# Patient Record
Sex: Female | Born: 1965 | Race: Black or African American | Hispanic: No | Marital: Married
Health system: Southern US, Community
[De-identification: ages and names within clinical notes are randomized; demographics above are authoritative.]

## PROBLEM LIST (undated history)

## (undated) DIAGNOSIS — E785 Hyperlipidemia, unspecified: Secondary | ICD-10-CM

## (undated) DIAGNOSIS — I10 Essential (primary) hypertension: Secondary | ICD-10-CM

---

## 2004-08-14 ENCOUNTER — Encounter: Admission: RE | Admit: 2004-08-14 | Discharge: 2004-08-14 | Payer: Self-pay | Admitting: Family Medicine

## 2004-09-11 ENCOUNTER — Other Ambulatory Visit: Admission: RE | Admit: 2004-09-11 | Discharge: 2004-09-11 | Payer: Self-pay | Admitting: Obstetrics and Gynecology

## 2005-01-05 ENCOUNTER — Emergency Department: Payer: Self-pay | Admitting: Emergency Medicine

## 2005-04-21 ENCOUNTER — Ambulatory Visit: Payer: Self-pay | Admitting: Endocrinology

## 2006-06-14 ENCOUNTER — Ambulatory Visit: Payer: Self-pay | Admitting: Endocrinology

## 2007-06-22 ENCOUNTER — Ambulatory Visit: Payer: Self-pay | Admitting: Endocrinology

## 2009-12-13 ENCOUNTER — Emergency Department: Payer: Self-pay | Admitting: Emergency Medicine

## 2010-11-03 ENCOUNTER — Ambulatory Visit: Payer: Self-pay

## 2010-11-19 ENCOUNTER — Emergency Department: Payer: Self-pay | Admitting: Emergency Medicine

## 2011-11-26 ENCOUNTER — Ambulatory Visit
Admission: RE | Admit: 2011-11-26 | Discharge: 2011-11-26 | Disposition: A | Discharge status: Home / Self Care | Payer: BC Managed Care – PPO | Source: Ambulatory Visit | Attending: Family Medicine | Admitting: Family Medicine

## 2011-11-26 ENCOUNTER — Other Ambulatory Visit: Payer: Self-pay | Admitting: Family Medicine

## 2011-11-26 DIAGNOSIS — R202 Paresthesia of skin: Secondary | ICD-10-CM

## 2012-02-11 ENCOUNTER — Ambulatory Visit: Payer: Self-pay | Admitting: Family Medicine

## 2013-07-11 ENCOUNTER — Ambulatory Visit: Payer: Self-pay | Admitting: Family Medicine

## 2014-08-26 ENCOUNTER — Ambulatory Visit: Payer: Self-pay | Admitting: Family Medicine

## 2015-08-28 ENCOUNTER — Encounter: Payer: Self-pay | Admitting: *Deleted

## 2015-08-29 ENCOUNTER — Ambulatory Visit: Payer: BC Managed Care – PPO | Admitting: Anesthesiology

## 2015-08-29 ENCOUNTER — Encounter
Admission: RE | Disposition: A | Discharge status: Home Health | Payer: Self-pay | Source: Ambulatory Visit | Attending: Unknown Physician Specialty

## 2015-08-29 ENCOUNTER — Ambulatory Visit
Admission: RE | Admit: 2015-08-29 | Discharge: 2015-08-29 | Disposition: A | Discharge status: Home Health | Payer: BC Managed Care – PPO | Source: Ambulatory Visit | Attending: Unknown Physician Specialty | Admitting: Unknown Physician Specialty

## 2015-08-29 DIAGNOSIS — Z79899 Other long term (current) drug therapy: Secondary | ICD-10-CM | POA: Diagnosis not present

## 2015-08-29 DIAGNOSIS — D124 Benign neoplasm of descending colon: Secondary | ICD-10-CM | POA: Diagnosis not present

## 2015-08-29 DIAGNOSIS — E785 Hyperlipidemia, unspecified: Secondary | ICD-10-CM | POA: Insufficient documentation

## 2015-08-29 DIAGNOSIS — Z1211 Encounter for screening for malignant neoplasm of colon: Secondary | ICD-10-CM | POA: Diagnosis not present

## 2015-08-29 DIAGNOSIS — I10 Essential (primary) hypertension: Secondary | ICD-10-CM | POA: Diagnosis not present

## 2015-08-29 DIAGNOSIS — K640 First degree hemorrhoids: Secondary | ICD-10-CM | POA: Insufficient documentation

## 2015-08-29 HISTORY — DX: Essential (primary) hypertension: I10

## 2015-08-29 HISTORY — PX: COLONOSCOPY WITH PROPOFOL: SHX5780

## 2015-08-29 HISTORY — DX: Hyperlipidemia, unspecified: E78.5

## 2015-08-29 SURGERY — COLONOSCOPY WITH PROPOFOL
Anesthesia: General

## 2015-08-29 MED ORDER — MIDAZOLAM HCL 2 MG/2ML IJ SOLN
INTRAMUSCULAR | Status: DC | PRN
  Administered 2015-08-29: 2 mg via INTRAVENOUS

## 2015-08-29 MED ORDER — SODIUM CHLORIDE 0.9 % IV SOLN: INTRAVENOUS | Status: DC

## 2015-08-29 MED ORDER — PROPOFOL 10 MG/ML IV BOLUS
INTRAVENOUS | Status: DC | PRN
  Administered 2015-08-29: 60 mg via INTRAVENOUS

## 2015-08-29 MED ORDER — PROPOFOL 500 MG/50ML IV EMUL
INTRAVENOUS | Status: DC | PRN
  Administered 2015-08-29: 75 ug/kg/min via INTRAVENOUS

## 2015-08-29 MED ORDER — SODIUM CHLORIDE 0.9 % IV SOLN
INTRAVENOUS | Status: DC
  Administered 2015-08-29: 1000 mL via INTRAVENOUS

## 2015-08-29 NOTE — H&P (Signed)
   Primary Care Physician:  Cammy Copa, MD Primary Gastroenterologist:  Dr. Vira Agar  Pre-Procedure History & Physical: HPI:  Hannah Harmon is a 49 y.o. female is here for an colonoscopy.   Past Medical History  Diagnosis Date  . Hypertension   . Hyperlipidemia     No past surgical history on file.  Prior to Admission medications   Medication Sig Start Date End Date Taking? Authorizing Provider  amLODipine (NORVASC) 10 MG tablet Take 10 mg by mouth daily.   Yes Historical Provider, MD  estradiol (CLIMARA - DOSED IN MG/24 HR) 0.05 mg/24hr patch Place 0.05 mg onto the skin once a week.   Yes Historical Provider, MD  fexofenadine (ALLEGRA) 180 MG tablet Take 180 mg by mouth daily.   Yes Historical Provider, MD  hydrochlorothiazide (HYDRODIURIL) 25 MG tablet Take 25 mg by mouth daily.   Yes Historical Provider, MD  simvastatin (ZOCOR) 20 MG tablet Take 20 mg by mouth daily.   Yes Historical Provider, MD    Allergies as of 07/03/2015  . (Not on File)    No family history on file.  Social History   Social History  . Marital Status: Married    Spouse Name: N/A  . Number of Children: N/A  . Years of Education: N/A   Occupational History  . Not on file.   Social History Main Topics  . Smoking status: Not on file  . Smokeless tobacco: Not on file  . Alcohol Use: Not on file  . Drug Use: Not on file  . Sexual Activity: Not on file   Other Topics Concern  . Not on file   Social History Narrative  . No narrative on file    Review of Systems: See HPI, otherwise negative ROS  Physical Exam: BP 140/86 mmHg  Pulse 84  Temp(Src) 97.9 F (36.6 C) (Tympanic)  Resp 16  Ht 5\' 5"  (1.651 m)  Wt 81.647 kg (180 lb)  BMI 29.95 kg/m2  SpO2 97% General:   Alert,  pleasant and cooperative in NAD Head:  Normocephalic and atraumatic. Neck:  Supple; no masses or thyromegaly. Lungs:  Clear throughout to auscultation.    Heart:  Regular rate and rhythm. Abdomen:   Soft, nontender and nondistended. Normal bowel sounds, without guarding, and without rebound.   Neurologic:  Alert and  oriented x4;  grossly normal neurologically.  Impression/Plan: Hannah Harmon is here for an colonoscopy to be performed for screening  Risks, benefits, limitations, and alternatives regarding  colonoscopy have been reviewed with the patient.  Questions have been answered.  All parties agreeable.   Gaylyn Cheers, MD  08/29/2015, 11:40 AM

## 2015-08-29 NOTE — Transfer of Care (Signed)
Immediate Anesthesia Transfer of Care Note  Patient: Hannah Harmon  Procedure(s) Performed: Procedure(s): COLONOSCOPY WITH PROPOFOL (N/A)  Patient Location: PACU  Anesthesia Type:General  Level of Consciousness: awake, alert  and oriented  Airway & Oxygen Therapy: Patient Spontanous Breathing  Post-op Assessment: Post -op Vital signs reviewed and stable  Post vital signs: Reviewed  Last Vitals:  Filed Vitals:   08/29/15 1044  BP: 140/86  Pulse: 84  Temp: 36.6 C  Resp: 16    Complications: No apparent anesthesia complications

## 2015-08-29 NOTE — Anesthesia Preprocedure Evaluation (Signed)
Anesthesia Evaluation  Patient identified by MRN, date of birth, ID band  Reviewed: Allergy & Precautions, Patient's Chart, lab work & pertinent test results  Airway Mallampati: II       Dental no notable dental hx.    Pulmonary neg pulmonary ROS,           Cardiovascular Exercise Tolerance: Good hypertension, Pt. on medications  Rhythm:Regular Rate:Normal     Neuro/Psych negative neurological ROS     GI/Hepatic negative GI ROS, Neg liver ROS,   Endo/Other  negative endocrine ROS  Renal/GU negative Renal ROS     Musculoskeletal negative musculoskeletal ROS (+)   Abdominal   Peds negative pediatric ROS (+)  Hematology negative hematology ROS (+)   Anesthesia Other Findings   Reproductive/Obstetrics negative OB ROS                             Anesthesia Physical Anesthesia Plan  ASA: II  Anesthesia Plan: General   Post-op Pain Management:    Induction: Intravenous  Airway Management Planned: Natural Airway and Nasal Cannula  Additional Equipment:   Intra-op Plan:   Post-operative Plan:   Informed Consent: I have reviewed the patients History and Physical, chart, labs and discussed the procedure including the risks, benefits and alternatives for the proposed anesthesia with the patient or authorized representative who has indicated his/her understanding and acceptance.     Plan Discussed with: CRNA  Anesthesia Plan Comments:         Anesthesia Quick Evaluation

## 2015-08-29 NOTE — Op Note (Signed)
[facility] Gastroenterology Patient Name: Hannah Harmon Procedure Date: 08/29/2015 11:32 AM MRN: [id number] Account #: [id number] Date of Birth: 1965/11/11 Admit Type: Outpatient Age: 50 Room: [facility] ENDO ROOM 1 Gender: Female Note Status: Finalized Procedure:            Colonoscopy Indications:          Screening for colorectal malignant neoplasm Providers:            Manya Silvas, MD Referring MD:         Aura Dials, MD (Referring MD) Medicines:            Propofol per Anesthesia Complications:        No immediate complications. Procedure:            Pre-Anesthesia Assessment:                       - After reviewing the risks and benefits, the patient                        was deemed in satisfactory condition to undergo the                        procedure.                       After obtaining informed consent, the colonoscope was                        passed under direct vision. Throughout the procedure,                        the patient's blood pressure, pulse, and oxygen                        saturations were monitored continuously. The                        Colonoscope was introduced through the anus and                        advanced to the the cecum, identified by appendiceal                        orifice and ileocecal valve. The colonoscopy was                        performed without difficulty. The patient tolerated the                        procedure well. The quality of the bowel preparation                        was excellent. Findings:      A 10 mm polyp was found in the distal descending colon. The polyp was       semi-pedunculated. The polyp was removed with a hot snare. Resection and       retrieval were complete.      Internal hemorrhoids were found during endoscopy. The hemorrhoids were       small and Grade I (internal hemorrhoids that do not prolapse).      The exam  was otherwise without abnormality. Impression:            - One 10 mm polyp in the distal descending colon,                        removed with a hot snare. Resected and retrieved.                       - Internal hemorrhoids.                       - The examination was otherwise normal. Recommendation:       - Await pathology results. Manya Silvas, MD 08/29/2015 12:04:51 PM This report has been signed electronically. Number of Addenda: 0 Note Initiated On: 08/29/2015 11:32 AM Scope Withdrawal Time: 0 hours 11 minutes 54 seconds  Total Procedure Duration: 0 hours 18 minutes 39 seconds       [facility]

## 2015-08-29 NOTE — Anesthesia Postprocedure Evaluation (Signed)
Anesthesia Post Note  Patient: Hannah Harmon  Procedure(s) Performed: Procedure(s) (LRB): COLONOSCOPY WITH PROPOFOL (N/A)  Patient location during evaluation: PACU Anesthesia Type: General Level of consciousness: awake Pain management: pain level controlled Vital Signs Assessment: post-procedure vital signs reviewed and stable Respiratory status: spontaneous breathing Cardiovascular status: blood pressure returned to baseline Anesthetic complications: no    Last Vitals:  Filed Vitals:   08/29/15 1217 08/29/15 1219  BP: 129/88 129/88  Pulse: 80 80  Temp: 36.2 C   Resp: 16 11    Last Pain: There were no vitals filed for this visit.               VAN STAVEREN,Raynard Mapps

## 2015-08-29 NOTE — Anesthesia Postprocedure Evaluation (Signed)
Anesthesia Post Note  Patient: Hannah Harmon  Procedure(s) Performed: Procedure(s) (LRB): COLONOSCOPY WITH PROPOFOL (N/A)  Anesthesia Type: General Level of consciousness: awake Pain management: pain level controlled Vital Signs Assessment: post-procedure vital signs reviewed and stable Respiratory status: spontaneous breathing Cardiovascular status: blood pressure returned to baseline Anesthetic complications: no    Last Vitals:  Filed Vitals:   08/29/15 1217 08/29/15 1219  BP: 129/88 129/88  Pulse: 80 80  Temp: 36.2 C   Resp: 16 11    Last Pain: There were no vitals filed for this visit.               VAN STAVEREN,Serafino Burciaga

## 2015-09-01 ENCOUNTER — Encounter: Payer: Self-pay | Admitting: Unknown Physician Specialty

## 2015-09-01 LAB — SURGICAL PATHOLOGY

## 2016-04-15 ENCOUNTER — Other Ambulatory Visit: Payer: Self-pay | Admitting: Family Medicine

## 2016-04-15 DIAGNOSIS — Z1231 Encounter for screening mammogram for malignant neoplasm of breast: Secondary | ICD-10-CM

## 2016-05-14 ENCOUNTER — Ambulatory Visit
Admission: RE | Admit: 2016-05-14 | Discharge: 2016-05-14 | Disposition: A | Discharge status: Home / Self Care | Payer: BC Managed Care – PPO | Source: Ambulatory Visit | Attending: Family Medicine | Admitting: Family Medicine

## 2016-05-14 DIAGNOSIS — Z1231 Encounter for screening mammogram for malignant neoplasm of breast: Secondary | ICD-10-CM | POA: Diagnosis not present

## 2017-09-22 ENCOUNTER — Other Ambulatory Visit: Payer: Self-pay | Admitting: Family Medicine

## 2018-04-10 ENCOUNTER — Other Ambulatory Visit: Payer: Self-pay | Admitting: Family Medicine

## 2018-04-10 DIAGNOSIS — Z1231 Encounter for screening mammogram for malignant neoplasm of breast: Secondary | ICD-10-CM

## 2018-05-01 ENCOUNTER — Ambulatory Visit
Admission: RE | Admit: 2018-05-01 | Discharge: 2018-05-01 | Disposition: A | Discharge status: Home / Self Care | Payer: BC Managed Care – PPO | Source: Ambulatory Visit | Attending: Family Medicine | Admitting: Family Medicine

## 2018-05-01 DIAGNOSIS — Z1231 Encounter for screening mammogram for malignant neoplasm of breast: Secondary | ICD-10-CM | POA: Diagnosis not present

## 2019-09-28 ENCOUNTER — Other Ambulatory Visit: Payer: Self-pay | Admitting: Family Medicine

## 2019-09-28 DIAGNOSIS — Z1231 Encounter for screening mammogram for malignant neoplasm of breast: Secondary | ICD-10-CM

## 2019-10-19 ENCOUNTER — Ambulatory Visit
Admission: RE | Admit: 2019-10-19 | Discharge: 2019-10-19 | Disposition: A | Discharge status: Home / Self Care | Payer: BC Managed Care – PPO | Source: Ambulatory Visit | Attending: Family Medicine | Admitting: Family Medicine

## 2019-10-19 DIAGNOSIS — Z1231 Encounter for screening mammogram for malignant neoplasm of breast: Secondary | ICD-10-CM

## 2019-10-31 ENCOUNTER — Ambulatory Visit: Payer: BC Managed Care – PPO | Admitting: Dermatology

## 2020-04-01 ENCOUNTER — Encounter: Payer: Self-pay | Admitting: *Deleted

## 2020-12-05 ENCOUNTER — Other Ambulatory Visit: Payer: Self-pay | Admitting: Family Medicine

## 2020-12-05 DIAGNOSIS — Z1231 Encounter for screening mammogram for malignant neoplasm of breast: Secondary | ICD-10-CM

## 2020-12-18 ENCOUNTER — Other Ambulatory Visit: Payer: Self-pay

## 2020-12-18 ENCOUNTER — Ambulatory Visit
Admission: RE | Admit: 2020-12-18 | Discharge: 2020-12-18 | Disposition: A | Discharge status: Home / Self Care | Payer: BC Managed Care – PPO | Source: Ambulatory Visit | Attending: Family Medicine | Admitting: Family Medicine

## 2020-12-18 DIAGNOSIS — Z1231 Encounter for screening mammogram for malignant neoplasm of breast: Secondary | ICD-10-CM | POA: Diagnosis present

## 2021-04-02 ENCOUNTER — Telehealth: Payer: Self-pay

## 2021-04-02 NOTE — Telephone Encounter (Signed)
Called patient no answer no way to leave message

## 2021-04-14 ENCOUNTER — Encounter: Payer: Self-pay | Admitting: Family Medicine

## 2022-02-01 ENCOUNTER — Other Ambulatory Visit: Payer: Self-pay | Admitting: Family Medicine

## 2022-02-01 DIAGNOSIS — Z1231 Encounter for screening mammogram for malignant neoplasm of breast: Secondary | ICD-10-CM

## 2022-02-09 ENCOUNTER — Ambulatory Visit
Admission: RE | Admit: 2022-02-09 | Discharge: 2022-02-09 | Disposition: A | Discharge status: Home / Self Care | Payer: BC Managed Care – PPO | Source: Ambulatory Visit | Attending: Family Medicine | Admitting: Family Medicine

## 2022-02-09 DIAGNOSIS — Z1231 Encounter for screening mammogram for malignant neoplasm of breast: Secondary | ICD-10-CM | POA: Insufficient documentation

## 2022-11-04 IMAGING — MG MM DIGITAL SCREENING BILAT W/ TOMO AND CAD
8 series · 8 of 24 positions shown · non-contrast
Comparison: Previous exam(s).

CLINICAL DATA: Screening.

EXAM:
DIGITAL SCREENING BILATERAL MAMMOGRAM WITH TOMOSYNTHESIS AND CAD
TECHNIQUE: Bilateral screening digital craniocaudal and mediolateral oblique
mammograms were obtained. Bilateral screening digital breast
tomosynthesis was performed. The images were evaluated with
computer-aided detection.

[L CC synth-2D]
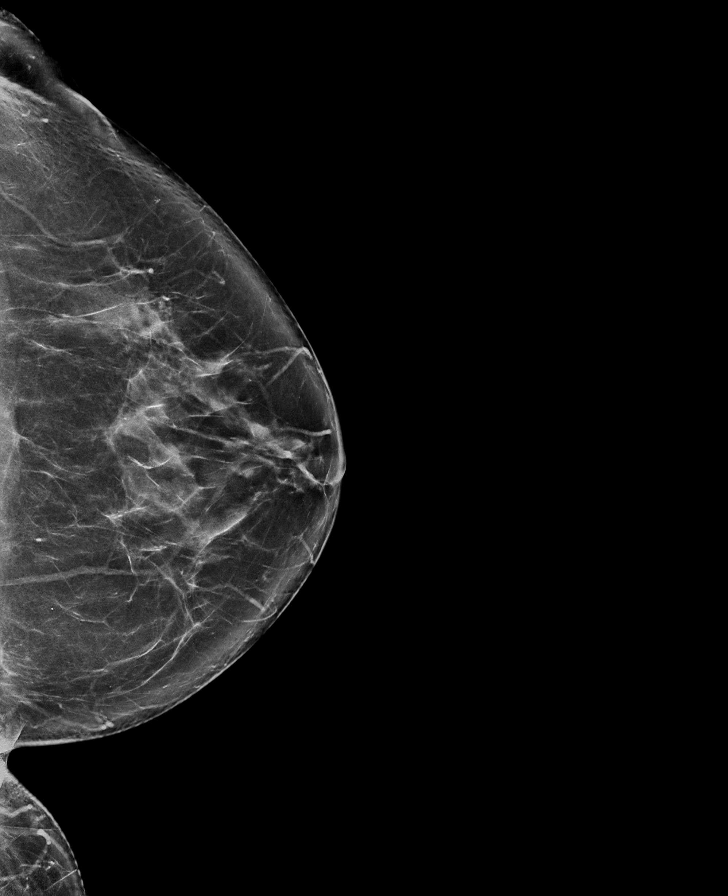

[R CC synth-2D]
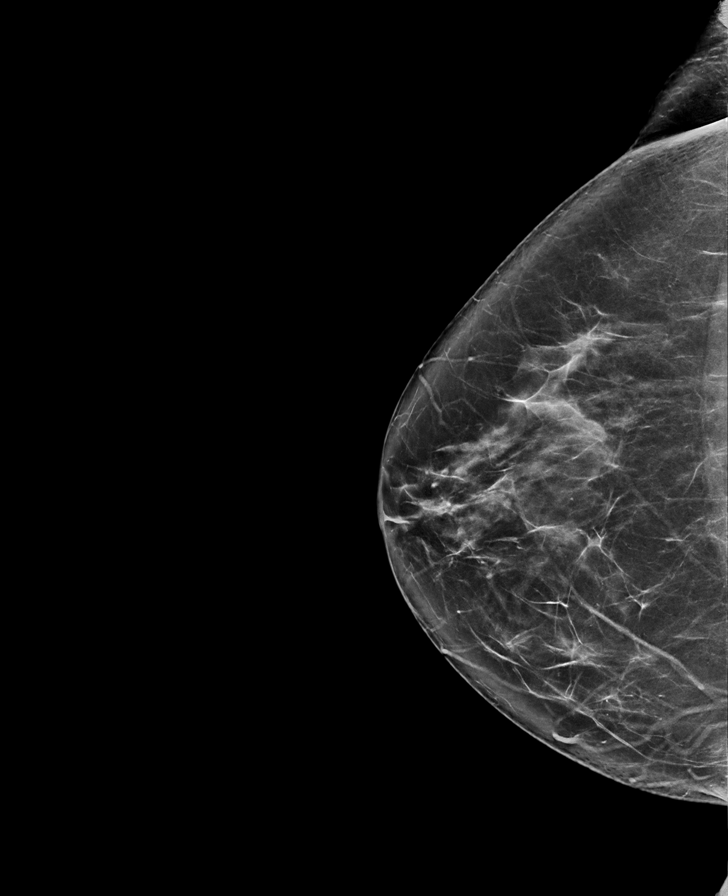

[R MLO synth-2D]
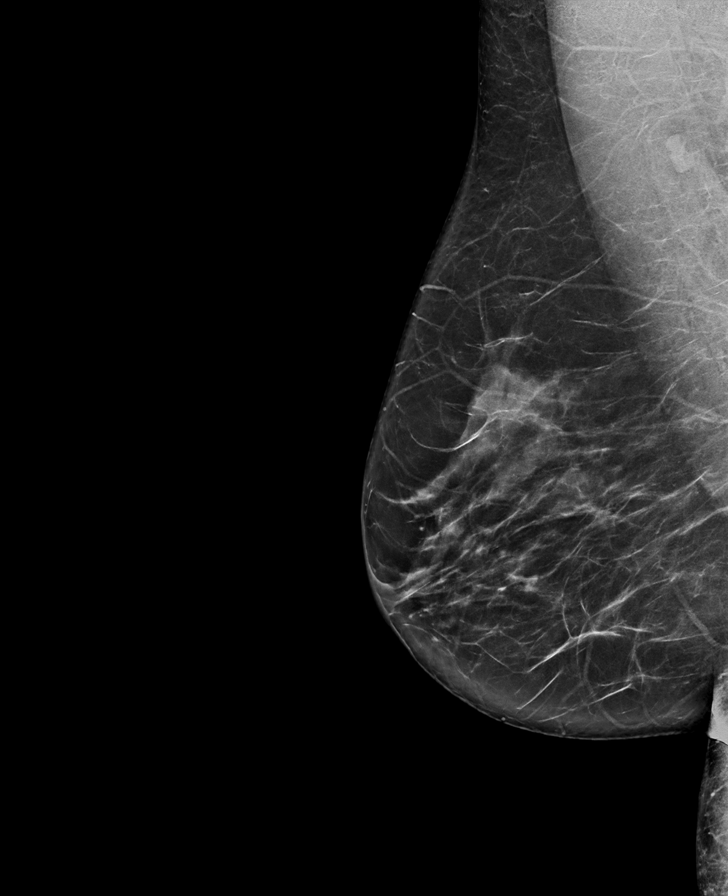

[L MLO synth-2D]
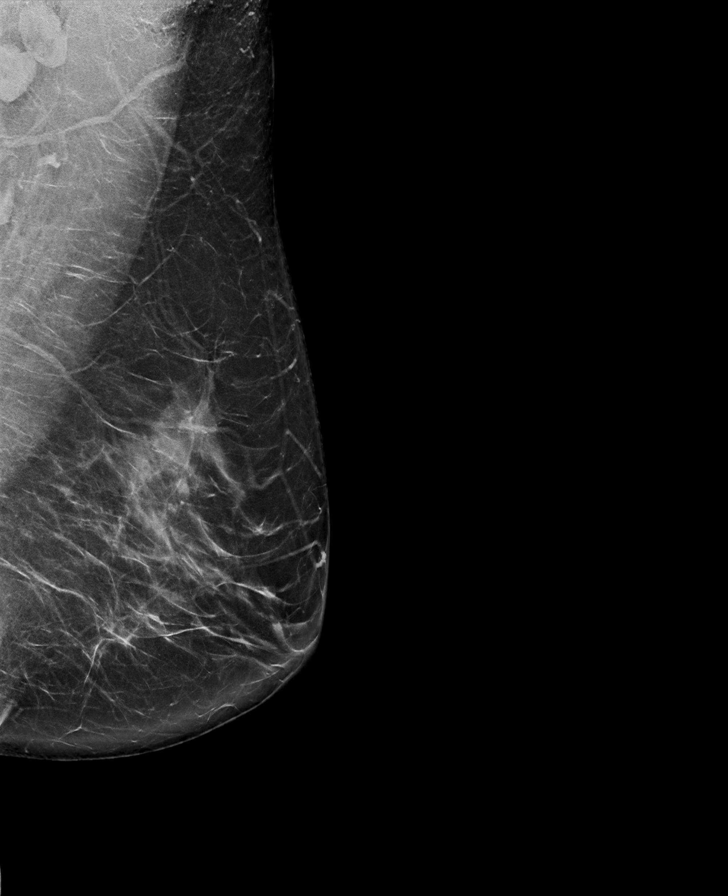

[R MLO tomo · tomo slice 42/83.0]
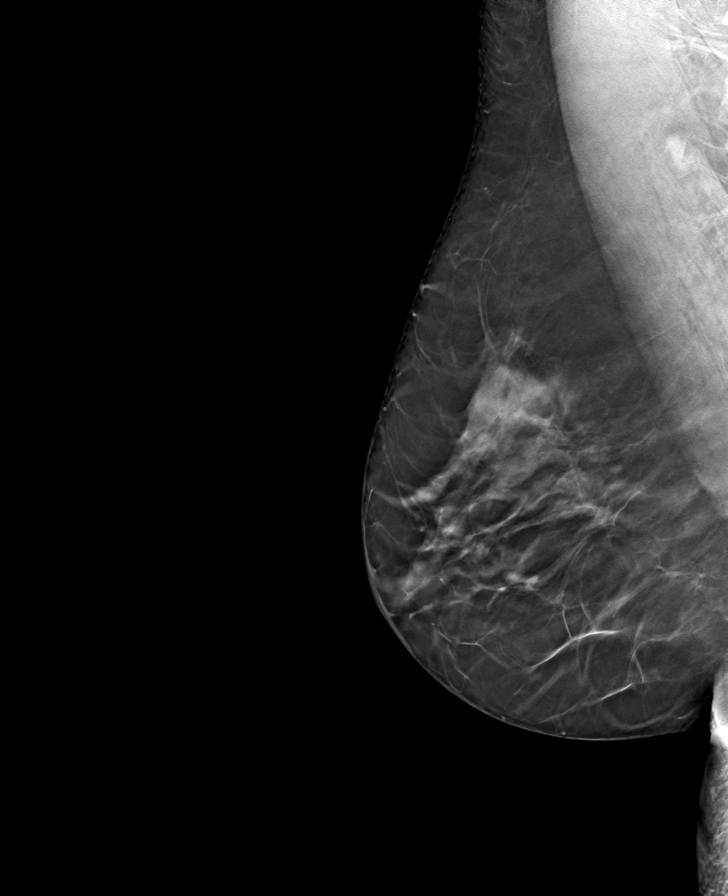

[L CC tomo · tomo slice 43/85.0]
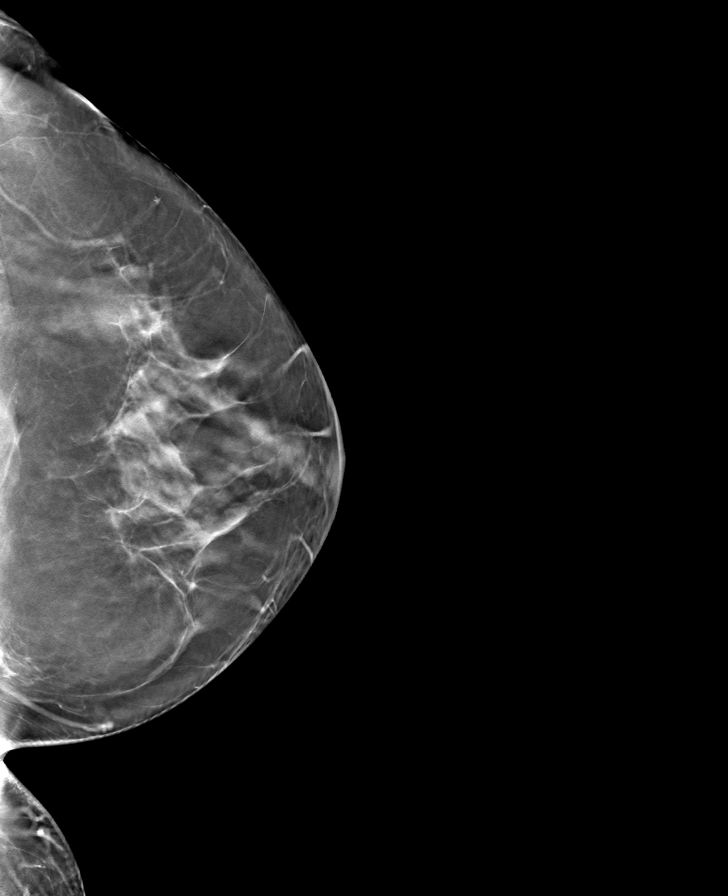

[R CC tomo · tomo slice 41/82.0]
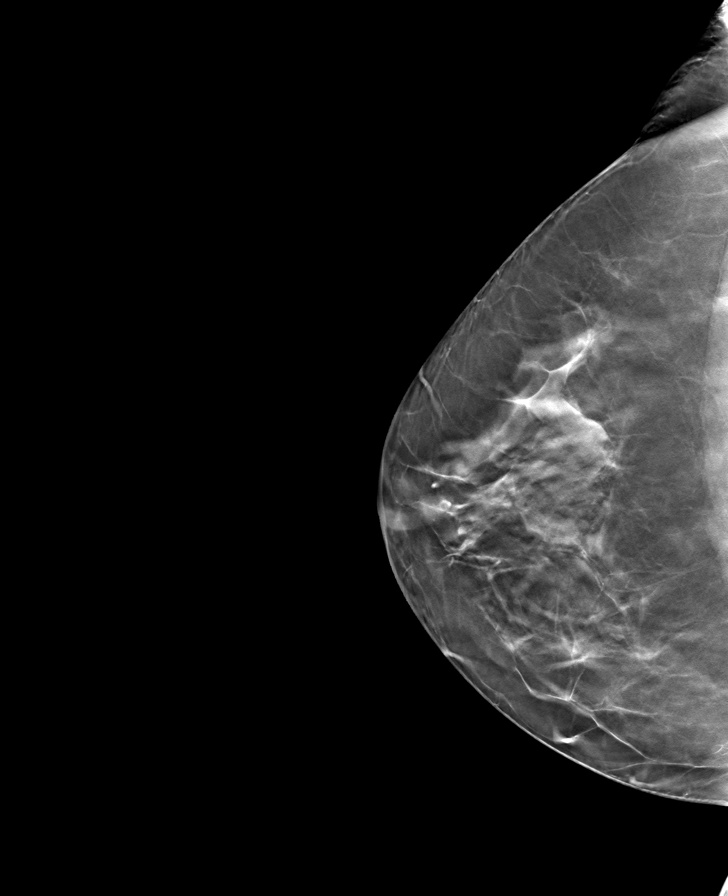

[L MLO tomo · tomo slice 45/89.0]
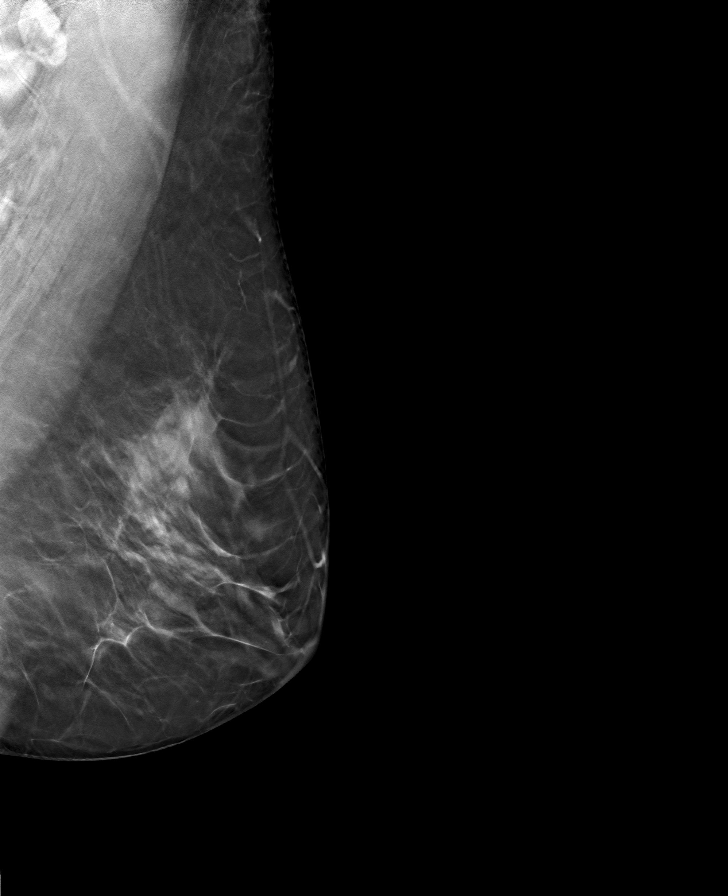

[8 of 24 positions shown; findings below may reference images not displayed]

ACR Breast Density Category c: The breast tissue is heterogeneously
dense, which may obscure small masses.
FINDINGS: There are no findings suspicious for malignancy. The images were
evaluated with computer-aided detection.
IMPRESSION: No mammographic evidence of malignancy. A result letter of this
screening mammogram will be mailed directly to the patient.

RECOMMENDATION:
Screening mammogram in one year. (Code:T4-5-GWO)

BI-RADS CATEGORY  1: Negative.

## 2023-04-18 ENCOUNTER — Encounter: Payer: Self-pay | Admitting: *Deleted

## 2023-05-09 ENCOUNTER — Other Ambulatory Visit: Payer: Self-pay | Admitting: Family Medicine

## 2023-05-09 DIAGNOSIS — Z1231 Encounter for screening mammogram for malignant neoplasm of breast: Secondary | ICD-10-CM

## 2023-06-24 ENCOUNTER — Ambulatory Visit
Admission: RE | Admit: 2023-06-24 | Discharge: 2023-06-24 | Disposition: A | Discharge status: Home / Self Care | Payer: BC Managed Care – PPO | Source: Ambulatory Visit | Attending: Family Medicine | Admitting: Family Medicine

## 2023-06-24 DIAGNOSIS — Z1231 Encounter for screening mammogram for malignant neoplasm of breast: Secondary | ICD-10-CM | POA: Insufficient documentation

## 2023-08-17 ENCOUNTER — Other Ambulatory Visit: Payer: Self-pay | Admitting: Emergency Medicine

## 2023-08-17 DIAGNOSIS — R918 Other nonspecific abnormal finding of lung field: Secondary | ICD-10-CM

## 2023-08-17 DIAGNOSIS — J986 Disorders of diaphragm: Secondary | ICD-10-CM

## 2023-08-26 ENCOUNTER — Ambulatory Visit
Admission: RE | Admit: 2023-08-26 | Discharge: 2023-08-26 | Disposition: A | Discharge status: Home / Self Care | Payer: 59 | Source: Ambulatory Visit | Attending: Emergency Medicine | Admitting: Emergency Medicine

## 2023-08-26 DIAGNOSIS — R918 Other nonspecific abnormal finding of lung field: Secondary | ICD-10-CM | POA: Diagnosis present

## 2023-08-26 DIAGNOSIS — J986 Disorders of diaphragm: Secondary | ICD-10-CM | POA: Diagnosis present

## 2024-06-29 ENCOUNTER — Other Ambulatory Visit: Payer: Self-pay | Admitting: Family Medicine

## 2024-06-29 DIAGNOSIS — Z1231 Encounter for screening mammogram for malignant neoplasm of breast: Secondary | ICD-10-CM

## 2024-07-02 ENCOUNTER — Telehealth: Payer: Self-pay

## 2024-07-02 NOTE — Telephone Encounter (Signed)
 Called patient';s PCP Dr. Frederik and let his office know that we have tried several times to contactpatient and no response from patient. Letter sent to the patient to call us  back if interested in scheduling her colonoscopy.

## 2024-07-09 ENCOUNTER — Ambulatory Visit
Admission: RE | Admit: 2024-07-09 | Discharge: 2024-07-09 | Disposition: A | Discharge status: Home / Self Care | Source: Ambulatory Visit | Attending: Family Medicine | Admitting: Family Medicine

## 2024-07-09 DIAGNOSIS — Z1231 Encounter for screening mammogram for malignant neoplasm of breast: Secondary | ICD-10-CM | POA: Diagnosis present
